# Patient Record
Sex: Female | Born: 1985 | Race: White | Hispanic: No | Marital: Single | Smoking: Never smoker
Health system: Southern US, Community
[De-identification: ages and names within clinical notes are randomized; demographics above are authoritative.]

## PROBLEM LIST (undated history)

## (undated) DIAGNOSIS — R569 Unspecified convulsions: Secondary | ICD-10-CM

## (undated) DIAGNOSIS — G629 Polyneuropathy, unspecified: Secondary | ICD-10-CM

---

## 2015-02-12 ENCOUNTER — Emergency Department (HOSPITAL_BASED_OUTPATIENT_CLINIC_OR_DEPARTMENT_OTHER): Payer: Medicaid - Out of State

## 2015-02-12 ENCOUNTER — Emergency Department (HOSPITAL_BASED_OUTPATIENT_CLINIC_OR_DEPARTMENT_OTHER)
Admission: EM | Admit: 2015-02-12 | Discharge: 2015-02-12 | Disposition: A | Discharge status: Home / Self Care | Payer: Medicaid - Out of State | Attending: Emergency Medicine | Admitting: Emergency Medicine

## 2015-02-12 ENCOUNTER — Encounter (HOSPITAL_BASED_OUTPATIENT_CLINIC_OR_DEPARTMENT_OTHER): Payer: Self-pay | Admitting: *Deleted

## 2015-02-12 DIAGNOSIS — Z3490 Encounter for supervision of normal pregnancy, unspecified, unspecified trimester: Secondary | ICD-10-CM

## 2015-02-12 DIAGNOSIS — Z79899 Other long term (current) drug therapy: Secondary | ICD-10-CM | POA: Insufficient documentation

## 2015-02-12 DIAGNOSIS — Z791 Long term (current) use of non-steroidal anti-inflammatories (NSAID): Secondary | ICD-10-CM | POA: Insufficient documentation

## 2015-02-12 DIAGNOSIS — O23591 Infection of other part of genital tract in pregnancy, first trimester: Secondary | ICD-10-CM | POA: Diagnosis not present

## 2015-02-12 DIAGNOSIS — N760 Acute vaginitis: Secondary | ICD-10-CM

## 2015-02-12 DIAGNOSIS — Z3A01 Less than 8 weeks gestation of pregnancy: Secondary | ICD-10-CM | POA: Diagnosis not present

## 2015-02-12 DIAGNOSIS — O209 Hemorrhage in early pregnancy, unspecified: Secondary | ICD-10-CM

## 2015-02-12 DIAGNOSIS — B9689 Other specified bacterial agents as the cause of diseases classified elsewhere: Secondary | ICD-10-CM

## 2015-02-12 DIAGNOSIS — Z8669 Personal history of other diseases of the nervous system and sense organs: Secondary | ICD-10-CM | POA: Insufficient documentation

## 2015-02-12 DIAGNOSIS — O9989 Other specified diseases and conditions complicating pregnancy, childbirth and the puerperium: Secondary | ICD-10-CM | POA: Diagnosis present

## 2015-02-12 HISTORY — DX: Polyneuropathy, unspecified: G62.9

## 2015-02-12 HISTORY — DX: Unspecified convulsions: R56.9

## 2015-02-12 LAB — URINE MICROSCOPIC-ADD ON

## 2015-02-12 LAB — WET PREP, GENITAL
Trich, Wet Prep: NONE SEEN
Yeast Wet Prep HPF POC: NONE SEEN

## 2015-02-12 LAB — URINALYSIS, ROUTINE W REFLEX MICROSCOPIC
Glucose, UA: NEGATIVE mg/dL
KETONES UR: 15 mg/dL — AB
Nitrite: NEGATIVE
Protein, ur: NEGATIVE mg/dL
SPECIFIC GRAVITY, URINE: 1.028 (ref 1.005–1.030)
UROBILINOGEN UA: 1 mg/dL (ref 0.0–1.0)
pH: 5.5 (ref 5.0–8.0)

## 2015-02-12 LAB — PREGNANCY, URINE: PREG TEST UR: POSITIVE — AB

## 2015-02-12 LAB — HCG, QUANTITATIVE, PREGNANCY: hCG, Beta Chain, Quant, S: 32734 m[IU]/mL — ABNORMAL HIGH (ref <5)

## 2015-02-12 MED ORDER — METRONIDAZOLE 500 MG PO TABS: 500.0000 mg | ORAL_TABLET | Freq: Two times a day (BID) | ORAL | Status: AC

## 2015-02-12 MED ORDER — METOCLOPRAMIDE HCL 5 MG/ML IJ SOLN
10.0000 mg | Freq: Once | INTRAMUSCULAR | Status: AC
  Administered 2015-02-12: 10 mg via INTRAVENOUS
  Filled 2015-02-12: qty 2

## 2015-02-12 MED ORDER — METOCLOPRAMIDE HCL 10 MG PO TABS: 10.0000 mg | ORAL_TABLET | Freq: Four times a day (QID) | ORAL | Status: AC

## 2015-02-12 MED ORDER — SODIUM CHLORIDE 0.9 % IV BOLUS (SEPSIS)
1000.0000 mL | Freq: Once | INTRAVENOUS | Status: AC
  Administered 2015-02-12: 1000 mL via INTRAVENOUS

## 2015-02-12 MED ORDER — PRENATAL COMPLETE 14-0.4 MG PO TABS: 14.0000 mg | ORAL_TABLET | Freq: Every day | ORAL | Status: AC

## 2015-02-12 NOTE — ED Notes (Signed)
Pt sts she found out 3 days ago that she is pregnant. Today she noticed light pink discharge.

## 2015-02-12 NOTE — ED Notes (Signed)
Patient transported to Ultrasound 

## 2015-02-12 NOTE — ED Notes (Signed)
Pt sts she cannot provide urine sample at this time.

## 2015-02-12 NOTE — Discharge Instructions (Signed)
Take Reglan as directed for nausea. Take prenatal vitamins daily. Take Flagyl twice daily for 1 week. Follow-up with OB/GYN.  Bacterial Vaginosis Bacterial vaginosis is a vaginal infection that occurs when the normal balance of bacteria in the vagina is disrupted. It results from an overgrowth of certain bacteria. This is the most common vaginal infection in women of childbearing age. Treatment is important to prevent complications, especially in pregnant women, as it can cause a premature delivery. CAUSES  Bacterial vaginosis is caused by an increase in harmful bacteria that are normally present in smaller amounts in the vagina. Several different kinds of bacteria can cause bacterial vaginosis. However, the reason that the condition develops is not fully understood. RISK FACTORS Certain activities or behaviors can put you at an increased risk of developing bacterial vaginosis, including:  Having a new sex partner or multiple sex partners.  Douching.  Using an intrauterine device (IUD) for contraception. Women do not get bacterial vaginosis from toilet seats, bedding, swimming pools, or contact with objects around them. SIGNS AND SYMPTOMS  Some women with bacterial vaginosis have no signs or symptoms. Common symptoms include:  Grey vaginal discharge.  A fishlike odor with discharge, especially after sexual intercourse.  Itching or burning of the vagina and vulva.  Burning or pain with urination. DIAGNOSIS  Your health care provider will take a medical history and examine the vagina for signs of bacterial vaginosis. A sample of vaginal fluid may be taken. Your health care provider will look at this sample under a microscope to check for bacteria and abnormal cells. A vaginal pH test may also be done.  TREATMENT  Bacterial vaginosis may be treated with antibiotic medicines. These may be given in the form of a pill or a vaginal cream. A second round of antibiotics may be prescribed if the  condition comes back after treatment.  HOME CARE INSTRUCTIONS   Only take over-the-counter or prescription medicines as directed by your health care provider.  If antibiotic medicine was prescribed, take it as directed. Make sure you finish it even if you start to feel better.  Do not have sex until treatment is completed.  Tell all sexual partners that you have a vaginal infection. They should see their health care provider and be treated if they have problems, such as a mild rash or itching.  Practice safe sex by using condoms and only having one sex partner. SEEK MEDICAL CARE IF:   Your symptoms are not improving after 3 days of treatment.  You have increased discharge or pain.  You have a fever. MAKE SURE YOU:   Understand these instructions.  Will watch your condition.  Will get help right away if you are not doing well or get worse. FOR MORE INFORMATION  Centers for Disease Control and Prevention, Division of STD Prevention: SolutionApps.[employer].za American Sexual Health Association (ASHA): www.ashastd.org  Document Released: 08/09/2005 Document Revised: 05/30/2013 Document Reviewed: 03/21/2013 [facility] Patient Information 2015 [place], [place]. This information is not intended to replace advice given to you by your health care provider. Make sure you discuss any questions you have with your health care provider.  Prenatal Care  WHAT IS PRENATAL CARE?  Prenatal care means health care during your pregnancy, before your baby is born. It is very important to take care of yourself and your baby during your pregnancy by:   Getting early prenatal care. If you know you are pregnant, or think you might be pregnant, call your health care provider as soon as  possible. Schedule a visit for a prenatal exam.  Getting regular prenatal care. Follow your health care provider's schedule for blood and other necessary tests. Do not miss appointments.  Doing everything you can to keep yourself and  your baby healthy during your pregnancy.  Getting complete care. Prenatal care should include evaluation of the medical, dietary, educational, psychological, and social needs of you and your significant other. The medical and genetic history of your family and the family of your baby's father should be discussed with your health care provider.  Discussing with your health care provider:  Prescription, over-the-counter, and herbal medicines that you take.  Any history of substance abuse, alcohol use, smoking, and illegal drug use.  Any history of domestic abuse and violence.  Immunizations you have received.  Your nutrition and diet.  The amount of exercise you do.  Any environmental and occupational hazards to which you are exposed.  History of sexually transmitted infections for both you and your partner.  Previous pregnancies you have had. WHY IS PRENATAL CARE SO IMPORTANT?  By regularly seeing your health care provider, you help ensure that problems can be identified early so that they can be treated as soon as possible. Other problems might be prevented. Many studies have shown that early and regular prenatal care is important for the health of mothers and their babies.  HOW CAN I TAKE CARE OF MYSELF WHILE I AM PREGNANT?  Here are ways to take care of yourself and your baby:   Start or continue taking your multivitamin with 400 micrograms (mcg) of folic acid every day.  Get early and regular prenatal care. It is very important to see a health care provider during your pregnancy. Your health care provider will check at each visit to make sure that you and your baby are healthy. If there are any problems, action can be taken right away to help you and your baby.  Eat a healthy diet that includes:  Fruits.  Vegetables.  Foods low in saturated fat.  Whole grains.  Calcium-rich foods, such as milk, yogurt, and hard cheeses.  Drink 6-8 glasses of liquids a day.  Unless  your health care provider tells you not to, try to be physically active for 30 minutes, most days of the week. If you are pressed for time, you can get your activity in through 10-minute segments, three times a day.  Do not smoke, drink alcohol, or use drugs. These can cause long-term damage to your baby. Talk with your health care provider about steps to take to stop smoking. Talk with a member of your faith community, a counselor, a trusted friend, or your health care provider if you are concerned about your alcohol or drug use.  Ask your health care provider before taking any medicine, even over-the-counter medicines. Some medicines are not safe to take during pregnancy.  Get plenty of rest and sleep.  Avoid hot tubs and saunas during pregnancy.  Do not have X-rays taken unless absolutely necessary and with the recommendation of your health care provider. A lead shield can be placed on your abdomen to protect your baby when X-rays are taken in other parts of your body.  Do not empty the cat litter when you are pregnant. It may contain a parasite that causes an infection called toxoplasmosis, which can cause birth defects. Also, use gloves when working in garden areas used by cats.  Do not eat uncooked or undercooked meats or fish.  Do not eat soft,  mold-ripened cheeses (Brie, Camembert, and chevre) or soft, blue-veined cheese (Danish blue and Roquefort).  Stay away from toxic chemicals like:  Insecticides.  Solvents (some cleaners or paint thinners).  Lead.  Mercury.  Sexual intercourse may continue until the end of the pregnancy, unless you have a medical problem or there is a problem with the pregnancy and your health care provider tells you not to.  Do not wear high-heel shoes, especially during the second half of the pregnancy. You can lose your balance and fall.  Do not take long trips, unless absolutely necessary. Be sure to see your health care provider before going on the  trip.  Do not sit in one position for more than 2 hours when on a trip.  Take a copy of your medical records when going on a trip. Know where a hospital is located in the city you are visiting, in case of an emergency.  Most dangerous household products will have pregnancy warnings on their labels. Ask your health care provider about products if you are unsure.  Limit or eliminate your caffeine intake from coffee, tea, sodas, medicines, and chocolate.  Many women continue working through pregnancy. Staying active might help you stay healthier. If you have a question about the safety or the hours you work at your particular job, talk with your health care provider.  Get informed:  Read books.  Watch videos.  Go to childbirth classes for you and your significant other.  Talk with experienced moms.  Ask your health care provider about childbirth education classes for you and your partner. Classes can help you and your partner prepare for the birth of your baby.  Ask about a baby doctor (pediatrician) and methods and pain medicine for labor, delivery, and possible cesarean delivery. HOW OFTEN SHOULD I SEE MY HEALTH CARE PROVIDER DURING PREGNANCY?  Your health care provider will give you a schedule for your prenatal visits. You will have visits more often as you get closer to the end of your pregnancy. An average pregnancy lasts about 40 weeks.  A typical schedule includes visiting your health care provider:   About once each month during your first 6 months of pregnancy.  Every 2 weeks during the next 2 months.  Weekly in the last month, until the delivery date. Your health care provider will probably want to see you more often if:  You are older than 35 years.  Your pregnancy is high risk because you have certain health problems or problems with the pregnancy, such as:  Diabetes.  High blood pressure.  The baby is not growing on schedule, according to the dates of the  pregnancy. Your health care provider will do special tests to make sure you and your baby are not having any serious problems. WHAT HAPPENS DURING PRENATAL VISITS?   At your first prenatal visit, your health care provider will do a physical exam and talk to you about your health history and the health history of your partner and your family. Your health care provider will be able to tell you what date to expect your baby to be born on.  Your first physical exam will include checks of your blood pressure, measurements of your height and weight, and an exam of your pelvic organs. Your health care provider will do a Pap test if you have not had one recently and will do cultures of your cervix to make sure there is no infection.  At each prenatal visit, there will be tests  of your blood, urine, blood pressure, weight, and the progress of the baby will be checked.  At your later prenatal visits, your health care provider will check how you are doing and how your baby is developing. You may have a number of tests done as your pregnancy progresses.  Ultrasound exams are often used to check on your baby's growth and health.  You may have more urine and blood tests, as well as special tests, if needed. These may include amniocentesis to examine fluid in the pregnancy sac, stress tests to check how the baby responds to contractions, or a biophysical profile to measure your baby's well-being. Your health care provider will explain the tests and why they are necessary.  You should be tested for high blood sugar (gestational diabetes) between the 24th and 28th weeks of your pregnancy.  You should discuss with your health care provider your plans to breastfeed or bottle-feed your baby.  Each visit is also a chance for you to learn about staying healthy during pregnancy and to ask questions. Document Released: 08/12/2003 Document Revised: 08/14/2013 Document Reviewed: 10/24/2013 [facility] Patient Information  2015 [place], [place]. This information is not intended to replace advice given to you by your health care provider. Make sure you discuss any questions you have with your health care provider.  First Trimester of Pregnancy The first trimester of pregnancy is from week 1 until the end of week 12 (months 1 through 3). A week after a sperm fertilizes an egg, the egg will implant on the wall of the uterus. This embryo will begin to develop into a baby. Genes from you and your partner are forming the baby. The female genes determine whether the baby is a boy or a girl. At 6-8 weeks, the eyes and face are formed, and the heartbeat can be seen on ultrasound. At the end of 12 weeks, all the baby's organs are formed.  Now that you are pregnant, you will want to do everything you can to have a healthy baby. Two of the most important things are to get good prenatal care and to follow your health care provider's instructions. Prenatal care is all the medical care you receive before the baby's birth. This care will help prevent, find, and treat any problems during the pregnancy and childbirth. BODY CHANGES Your body goes through many changes during pregnancy. The changes vary from woman to woman.   You may gain or lose a couple of pounds at first.  You may feel sick to your stomach (nauseous) and throw up (vomit). If the vomiting is uncontrollable, call your health care provider.  You may tire easily.  You may develop headaches that can be relieved by medicines approved by your health care provider.  You may urinate more often. Painful urination may mean you have a bladder infection.  You may develop heartburn as a result of your pregnancy.  You may develop constipation because certain hormones are causing the muscles that push waste through your intestines to slow down.  You may develop hemorrhoids or swollen, bulging veins (varicose veins).  Your breasts may begin to grow larger and become tender. Your  nipples may stick out more, and the tissue that surrounds them (areola) may become darker.  Your gums may bleed and may be sensitive to brushing and flossing.  Dark spots or blotches (chloasma, mask of pregnancy) may develop on your face. This will likely fade after the baby is born.  Your menstrual periods will stop.  You  may have a loss of appetite.  You may develop cravings for certain kinds of food.  You may have changes in your emotions from day to day, such as being excited to be pregnant or being concerned that something may go wrong with the pregnancy and baby.  You may have more vivid and strange dreams.  You may have changes in your hair. These can include thickening of your hair, rapid growth, and changes in texture. Some women also have hair loss during or after pregnancy, or hair that feels dry or thin. Your hair will most likely return to normal after your baby is born. WHAT TO EXPECT AT YOUR PRENATAL VISITS During a routine prenatal visit:  You will be weighed to make sure you and the baby are growing normally.  Your blood pressure will be taken.  Your abdomen will be measured to track your baby's growth.  The fetal heartbeat will be listened to starting around week 10 or 12 of your pregnancy.  Test results from any previous visits will be discussed. Your health care provider may ask you:  How you are feeling.  If you are feeling the baby move.  If you have had any abnormal symptoms, such as leaking fluid, bleeding, severe headaches, or abdominal cramping.  If you have any questions. Other tests that may be performed during your first trimester include:  Blood tests to find your blood type and to check for the presence of any previous infections. They will also be used to check for low iron levels (anemia) and Rh antibodies. Later in the pregnancy, blood tests for diabetes will be done along with other tests if problems develop.  Urine tests to check for  infections, diabetes, or protein in the urine.  An ultrasound to confirm the proper growth and development of the baby.  An amniocentesis to check for possible genetic problems.  Fetal screens for spina bifida and Down syndrome.  You may need other tests to make sure you and the baby are doing well. HOME CARE INSTRUCTIONS  Medicines  Follow your health care provider's instructions regarding medicine use. Specific medicines may be either safe or unsafe to take during pregnancy.  Take your prenatal vitamins as directed.  If you develop constipation, try taking a stool softener if your health care provider approves. Diet  Eat regular, well-balanced meals. Choose a variety of foods, such as meat or vegetable-based protein, fish, milk and low-fat dairy products, vegetables, fruits, and whole grain breads and cereals. Your health care provider will help you determine the amount of weight gain that is right for you.  Avoid raw meat and uncooked cheese. These carry germs that can cause birth defects in the baby.  Eating four or five small meals rather than three large meals a day may help relieve nausea and vomiting. If you start to feel nauseous, eating a few soda crackers can be helpful. Drinking liquids between meals instead of during meals also seems to help nausea and vomiting.  If you develop constipation, eat more high-fiber foods, such as fresh vegetables or fruit and whole grains. Drink enough fluids to keep your urine clear or pale yellow. Activity and Exercise  Exercise only as directed by your health care provider. Exercising will help you:  Control your weight.  Stay in shape.  Be prepared for labor and delivery.  Experiencing pain or cramping in the lower abdomen or low back is a good sign that you should stop exercising. Check with your health care  provider before continuing normal exercises.  Try to avoid standing for long periods of time. Move your legs often if you  must stand in one place for a long time.  Avoid heavy lifting.  Wear low-heeled shoes, and practice good posture.  You may continue to have sex unless your health care provider directs you otherwise. Relief of Pain or Discomfort  Wear a good support bra for breast tenderness.   Take warm sitz baths to soothe any pain or discomfort caused by hemorrhoids. Use hemorrhoid cream if your health care provider approves.   Rest with your legs elevated if you have leg cramps or low back pain.  If you develop varicose veins in your legs, wear support hose. Elevate your feet for 15 minutes, 3-4 times a day. Limit salt in your diet. Prenatal Care  Schedule your prenatal visits by the twelfth week of pregnancy. They are usually scheduled monthly at first, then more often in the last 2 months before delivery.  Write down your questions. Take them to your prenatal visits.  Keep all your prenatal visits as directed by your health care provider. Safety  Wear your seat belt at all times when driving.  Make a list of emergency phone numbers, including numbers for family, friends, the hospital, and police and fire departments. General Tips  Ask your health care provider for a referral to a local prenatal education class. Begin classes no later than at the beginning of month 6 of your pregnancy.  Ask for help if you have counseling or nutritional needs during pregnancy. Your health care provider can offer advice or refer you to specialists for help with various needs.  Do not use hot tubs, steam rooms, or saunas.  Do not douche or use tampons or scented sanitary pads.  Do not cross your legs for long periods of time.  Avoid cat litter boxes and soil used by cats. These carry germs that can cause birth defects in the baby and possibly loss of the fetus by miscarriage or stillbirth.  Avoid all smoking, herbs, alcohol, and medicines not prescribed by your health care provider. Chemicals in these  affect the formation and growth of the baby.  Schedule a dentist appointment. At home, brush your teeth with a soft toothbrush and be gentle when you floss. SEEK MEDICAL CARE IF:   You have dizziness.  You have mild pelvic cramps, pelvic pressure, or nagging pain in the abdominal area.  You have persistent nausea, vomiting, or diarrhea.  You have a bad smelling vaginal discharge.  You have pain with urination.  You notice increased swelling in your face, hands, legs, or ankles. SEEK IMMEDIATE MEDICAL CARE IF:   You have a fever.  You are leaking fluid from your vagina.  You have spotting or bleeding from your vagina.  You have severe abdominal cramping or pain.  You have rapid weight gain or loss.  You vomit blood or material that looks like coffee grounds.  You are exposed to [place] measles and have never had them.  You are exposed to fifth disease or chickenpox.  You develop a severe headache.  You have shortness of breath.  You have any kind of trauma, such as from a fall or a car accident. Document Released: 08/03/2001 Document Revised: 12/24/2013 Document Reviewed: 06/19/2013 [facility] Patient Information 2015 [place], [place]. This information is not intended to replace advice given to you by your health care provider. Make sure you discuss any questions you have with your health care  provider.

## 2015-02-12 NOTE — ED Provider Notes (Signed)
CSN: [id number]     Arrival date & time 02/12/15  1514 History   First MD Initiated Contact with Patient 02/12/15 1521     Chief Complaint  Patient presents with  . Vaginal Discharge     (Consider location/radiation/quality/duration/timing/severity/associated sxs/prior Treatment) HPI Comments: 29 y/o F G1P0 presenting after noticing light pink discharge yesterday. Three days ago, she took 3 positive home pregnancy tests. Over the past few days, she has felt very nauseous and has had multiple episodes of NBNB emesis, and is only able to keep down apples and crackers. States she is feeling weak. This morning, she noticed a smaller amount of light pink discharge, and she does wanted to make sure that everything was okay today. Admits to mild epigastric abdominal pain only when vomiting. Denies any other abdominal pain. Denies fevers or urinary symptoms.  The history is provided by the patient.    Past Medical History  Diagnosis Date  . Seizures   . Neuropathy    History reviewed. No pertinent past surgical history. No family history on file. History  Substance Use Topics  . Smoking status: Never Smoker   . Smokeless tobacco: Not on file  . Alcohol Use: No   OB History    No data available     Review of Systems  Genitourinary: Positive for vaginal bleeding.  All other systems reviewed and are negative.     Allergies  Review of patient's allergies indicates no known allergies.  Home Medications   Prior to Admission medications   Medication Sig Start Date End Date Taking? Authorizing Provider  meloxicam (MOBIC) 7.5 MG tablet Take 7.5 mg by mouth daily.   Yes Historical Provider, MD  tiZANidine (ZANAFLEX) 4 MG tablet Take 4 mg by mouth every 6 (six) hours as needed for muscle spasms.   Yes Historical Provider, MD  metoCLOPramide (REGLAN) 10 MG tablet Take 1 tablet (10 mg total) by mouth every 6 (six) hours. 02/12/15   Anubis Fundora M Aleczander Fandino, PA-C  metroNIDAZOLE (FLAGYL) 500 MG tablet  Take 1 tablet (500 mg total) by mouth 2 (two) times daily. One po bid x 7 days 02/12/15   Kathrynn Speed, PA-C  Prenatal Vit-Fe Fumarate-FA (PRENATAL COMPLETE) 14-0.4 MG TABS Take 14 mg by mouth daily. 02/12/15   Amaura Authier M Oyuki Hogan, PA-C   BP 112/59 mmHg  Pulse 76  Temp(Src) 98.1 F (36.7 C) (Oral)  Resp 18  Ht 5' (1.524 m)  Wt 121 lb (54.885 kg)  BMI 23.63 kg/m2  SpO2 100%  LMP 12/23/2014 Physical Exam  Constitutional: She is oriented to person, place, and time. She appears well-developed and well-nourished. No distress.  HENT:  Head: Normocephalic and atraumatic.  Mouth/Throat: Oropharynx is clear and moist.  Eyes: Conjunctivae and EOM are normal.  Neck: Normal range of motion. Neck supple.  Cardiovascular: Normal rate, regular rhythm and normal heart sounds.   Pulmonary/Chest: Effort normal and breath sounds normal. No respiratory distress.  Abdominal: Soft. Bowel sounds are normal. She exhibits no distension. There is no tenderness.  Genitourinary: Uterus normal. Cervix exhibits no motion tenderness, no discharge and no friability. Right adnexum displays no mass and no tenderness. Left adnexum displays no mass and no tenderness. No tenderness or bleeding in the vagina. No foreign body around the vagina. No vaginal discharge found.  Musculoskeletal: Normal range of motion. She exhibits no edema.  Neurological: She is alert and oriented to person, place, and time. No sensory deficit.  Skin: Skin is warm and dry.  Psychiatric:  She has a normal mood and affect. Her behavior is normal.  Nursing note and vitals reviewed.   ED Course  Procedures (including critical care time) Labs Review Labs Reviewed  WET PREP, GENITAL - Abnormal; Notable for the following:    Clue Cells Wet Prep HPF POC MODERATE (*)    WBC, Wet Prep HPF POC MODERATE (*)    All other components within normal limits  URINALYSIS, ROUTINE W REFLEX MICROSCOPIC (NOT AT [facility]) - Abnormal; Notable for the following:    Color,  Urine AMBER (*)    APPearance CLOUDY (*)    Hgb urine dipstick MODERATE (*)    Bilirubin Urine SMALL (*)    Ketones, ur 15 (*)    Leukocytes, UA SMALL (*)    All other components within normal limits  PREGNANCY, URINE - Abnormal; Notable for the following:    Preg Test, Ur POSITIVE (*)    All other components within normal limits  HCG, QUANTITATIVE, PREGNANCY - Abnormal; Notable for the following:    hCG, Beta Chain, Quant, S 32734 (*)    All other components within normal limits  URINE MICROSCOPIC-ADD ON - Abnormal; Notable for the following:    Squamous Epithelial / LPF MANY (*)    Bacteria, UA FEW (*)    Crystals CA OXALATE CRYSTALS (*)    All other components within normal limits  URINE CULTURE  GC/CHLAMYDIA PROBE AMP ([place]) NOT AT [facility]    Imaging Review US Ob Comp Less 14 Wks  02/12/2015   CLINICAL DATA:  Patient with vaginal spotting.  EXAM: OBSTETRIC <14 WK [place] AND TRANSVAGINAL OB US  TECHNIQUE: Both transabdominal and transvaginal ultrasound examinations were performed for complete evaluation of the gestation as well as the maternal uterus, adnexal regions, and pelvic cul-de-sac. Transvaginal technique was performed to assess early pregnancy.  COMPARISON:  None.  FINDINGS: Intrauterine gestational sac: Visualized/normal in shape.  Yolk sac:  Present  Embryo:  Present  Cardiac Activity: Present  Heart Rate: 99  bpm  CRL:  2  mm   5 w   5 d                  [place] EDC: 10/08/2015  Maternal uterus/adnexae: Normal right and left ovaries. No subchorionic hemorrhage. No free fluid in the pelvis.  IMPRESSION: Single live intrauterine gestation 6 weeks 0 days by crown-rump length.   Electronically Signed   By: Annia Belt M.D.   On: 02/12/2015 18:07   US Ob Transvaginal  02/12/2015   CLINICAL DATA:  Patient with vaginal spotting.  EXAM: OBSTETRIC <14 WK [place] AND TRANSVAGINAL OB US  TECHNIQUE: Both transabdominal and transvaginal ultrasound examinations were performed for complete  evaluation of the gestation as well as the maternal uterus, adnexal regions, and pelvic cul-de-sac. Transvaginal technique was performed to assess early pregnancy.  COMPARISON:  None.  FINDINGS: Intrauterine gestational sac: Visualized/normal in shape.  Yolk sac:  Present  Embryo:  Present  Cardiac Activity: Present  Heart Rate: 99  bpm  CRL:  2  mm   5 w   5 d                  [place] EDC: 10/08/2015  Maternal uterus/adnexae: Normal right and left ovaries. No subchorionic hemorrhage. No free fluid in the pelvis.  IMPRESSION: Single live intrauterine gestation 6 weeks 0 days by crown-rump length.   Electronically Signed   By: Annia Belt M.D.   On: 02/12/2015 18:07  EKG Interpretation None      MDM   Final diagnoses:  Intrauterine pregnancy  BV (bacterial vaginosis)   Nontoxic appearing, NAD. AF VSS. Abdomen is soft and nontender. No vaginal bleeding noted on exam. Urine pregnancy positive. Wet prep significant for clue cells. Will treat BV with Flagyl. Ultrasound obtained given the patient stated some spotting with a positive pregnancy. Single live intrauterine gestation 6 weeks 0 days noted. Discussed these findings with patient, will start her on prenatal vitamins, Reglan for nausea. Tolerating PO. Discussed importance of OB/GYN follow-up. Stable for discharge. Return precautions given. Patient states understanding of treatment care plan and is agreeable.  Kathrynn Speed, PA-C 02/12/15 1610  Rolan Bucco, MD 02/12/15 [phone]

## 2015-02-13 LAB — GC/CHLAMYDIA PROBE AMP (~~LOC~~) NOT AT ARMC
CHLAMYDIA, DNA PROBE: NEGATIVE
Neisseria Gonorrhea: NEGATIVE

## 2015-02-15 LAB — URINE CULTURE

## 2017-02-09 IMAGING — US US OB TRANSVAGINAL
1 series · 14 of 28 positions shown · non-contrast
Comparison: None.

CLINICAL DATA: Patient with vaginal spotting.

EXAM:
OBSTETRIC <14 WK US AND TRANSVAGINAL OB US
TECHNIQUE: Both transabdominal and transvaginal ultrasound examinations were
performed for complete evaluation of the gestation as well as the
maternal uterus, adnexal regions, and pelvic cul-de-sac.
Transvaginal technique was performed to assess early pregnancy.

[Series 1: us ob transvaginal · 0.09mm/px · 31 acquisitions, 14 frames shown]
[im 2/31]
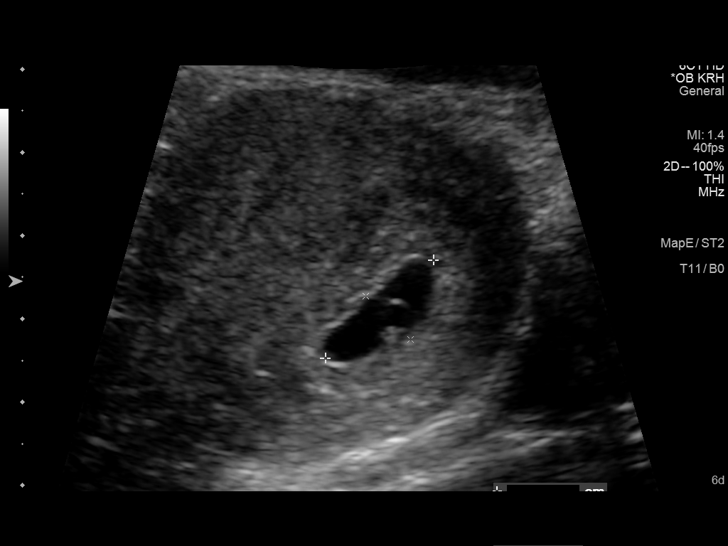
[im 4/31]
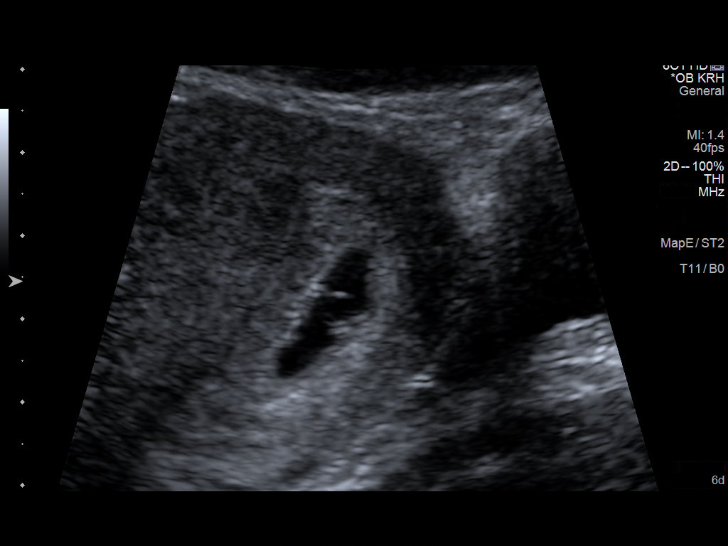
[im 6/31]
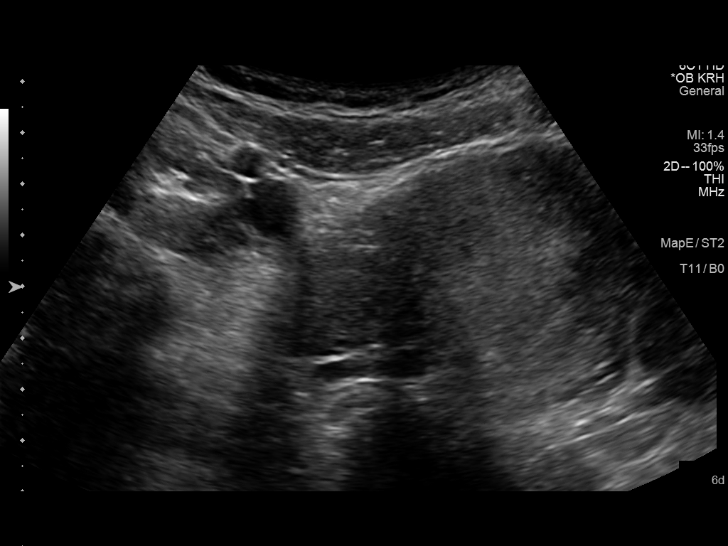
[im 8/31]
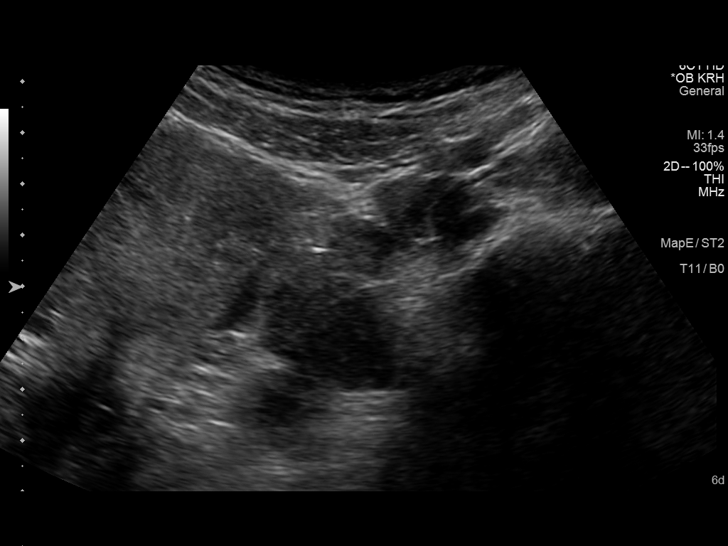
[im 11/31]
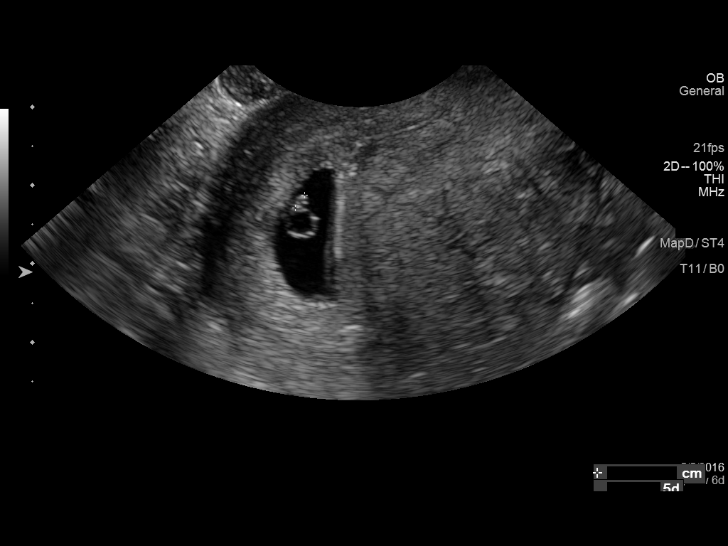
[im 13/31]
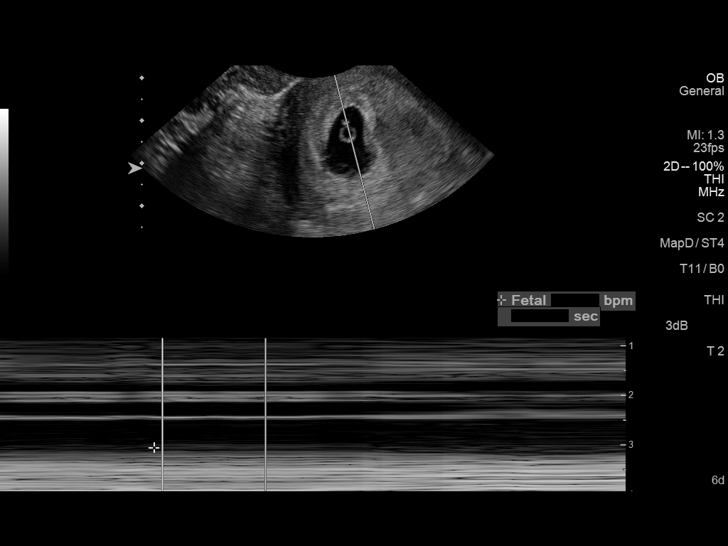
[im 15/31]
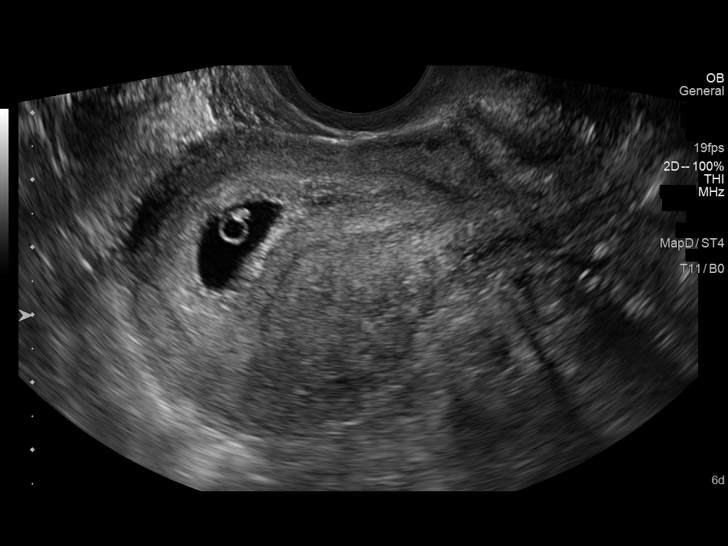
[im 17/31]
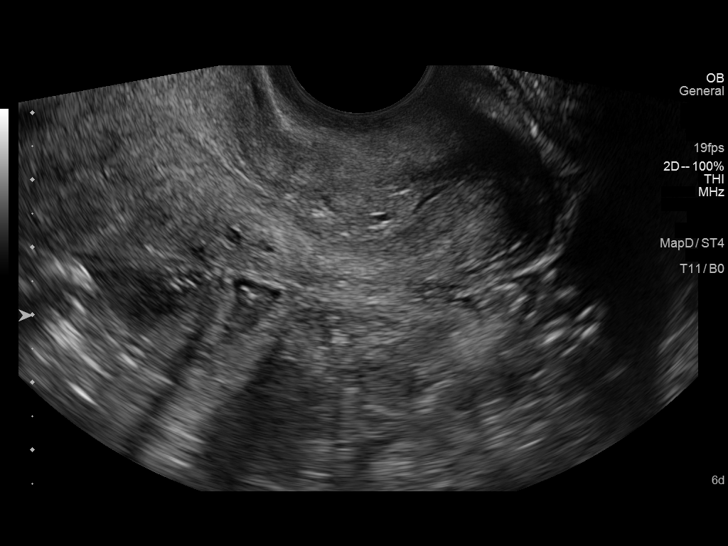
[im 19/31]
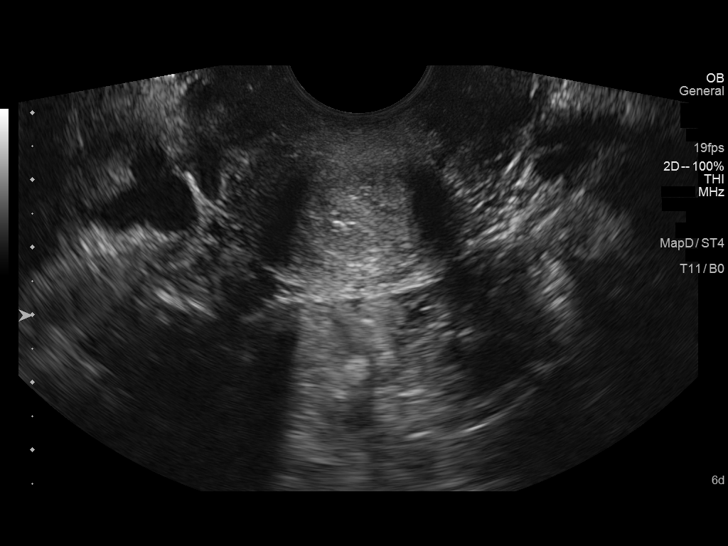
[im 22/31]
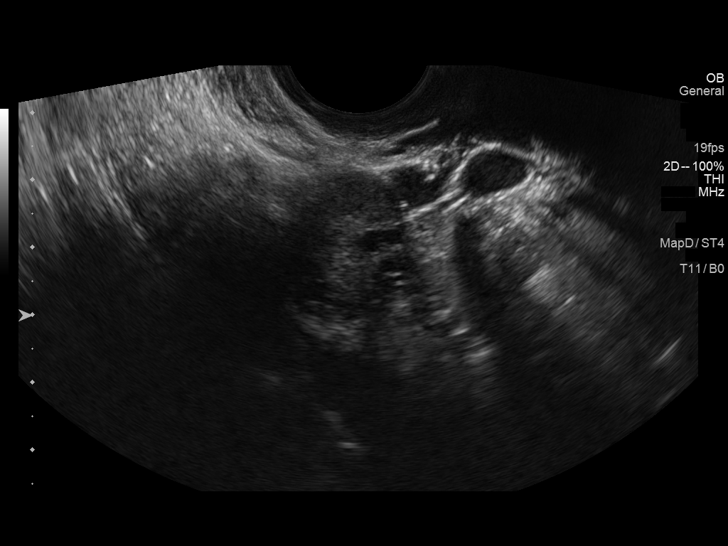
[im 24/31]
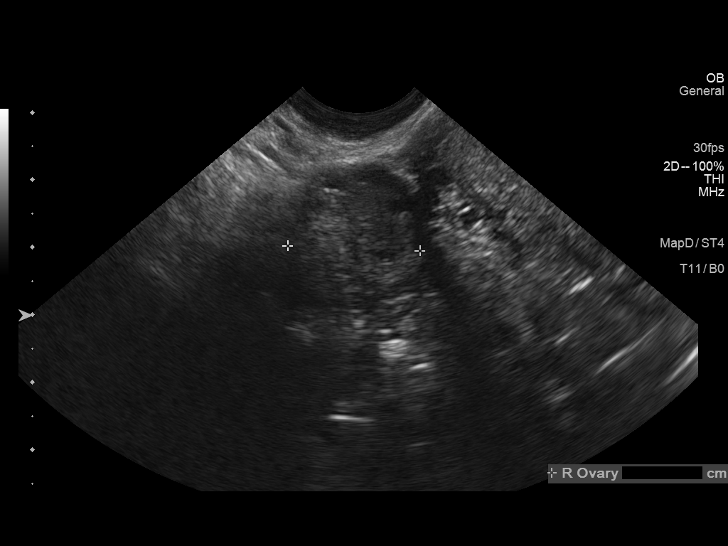
[im 26/31]
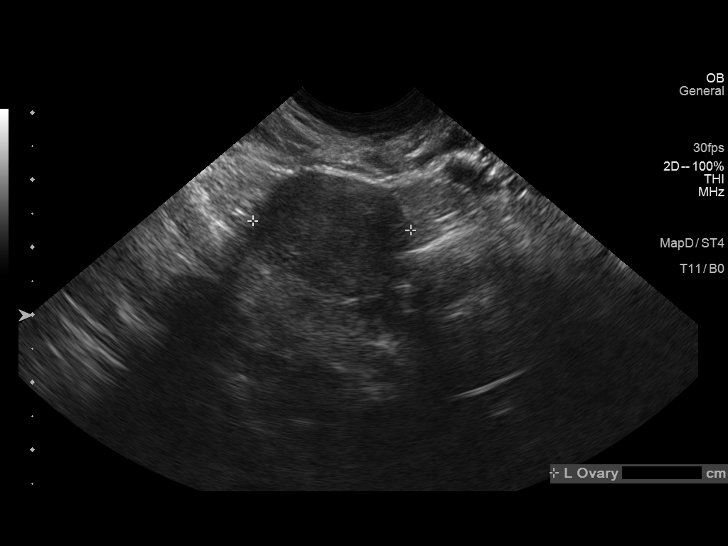
[im 28/31]
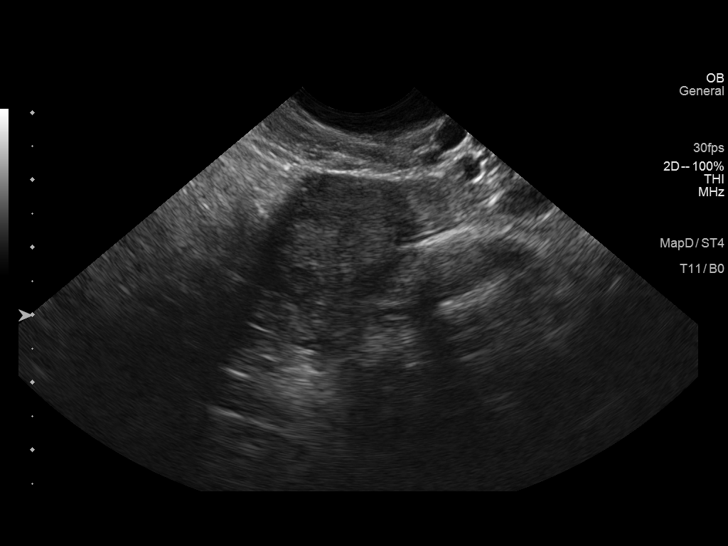
[im 31/31]
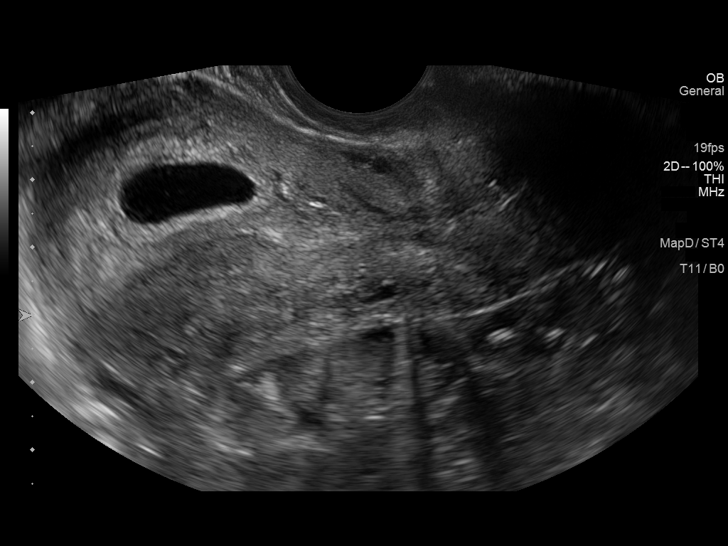

[14 of 28 positions shown; findings below may reference images not displayed]

FINDINGS: Intrauterine gestational sac: Visualized/normal in shape.

Yolk sac:  Present

Embryo:  Present

Cardiac Activity: Present

Heart Rate: 99  bpm

CRL:  2  mm   5 w   5 d                  [place] EDC: 10/08/2015

Maternal uterus/adnexae: Normal right and left ovaries. No
subchorionic hemorrhage. No free fluid in the pelvis.
IMPRESSION: Single live intrauterine gestation 6 weeks 0 days by crown-rump
length.

## 2019-05-01 ENCOUNTER — Other Ambulatory Visit: Payer: Self-pay

## 2019-05-01 DIAGNOSIS — Z20822 Contact with and (suspected) exposure to covid-19: Secondary | ICD-10-CM

## 2019-05-02 LAB — NOVEL CORONAVIRUS, NAA: SARS-CoV-2, NAA: NOT DETECTED

## 2023-11-06 ENCOUNTER — Encounter (HOSPITAL_BASED_OUTPATIENT_CLINIC_OR_DEPARTMENT_OTHER): Payer: Self-pay | Admitting: Emergency Medicine

## 2023-11-06 ENCOUNTER — Other Ambulatory Visit: Payer: Self-pay

## 2023-11-06 ENCOUNTER — Emergency Department (HOSPITAL_BASED_OUTPATIENT_CLINIC_OR_DEPARTMENT_OTHER)
Admission: EM | Admit: 2023-11-06 | Discharge: 2023-11-06 | Disposition: A | Discharge status: Home / Self Care | Attending: Emergency Medicine | Admitting: Emergency Medicine

## 2023-11-06 ENCOUNTER — Emergency Department (HOSPITAL_BASED_OUTPATIENT_CLINIC_OR_DEPARTMENT_OTHER)

## 2023-11-06 DIAGNOSIS — N83202 Unspecified ovarian cyst, left side: Secondary | ICD-10-CM | POA: Diagnosis not present

## 2023-11-06 DIAGNOSIS — R1032 Left lower quadrant pain: Secondary | ICD-10-CM | POA: Diagnosis present

## 2023-11-06 LAB — COMPREHENSIVE METABOLIC PANEL
ALT: 11 U/L (ref 0–44)
AST: 13 U/L — ABNORMAL LOW (ref 15–41)
Albumin: 3.9 g/dL (ref 3.5–5.0)
Alkaline Phosphatase: 50 U/L (ref 38–126)
Anion gap: 6 (ref 5–15)
BUN: 12 mg/dL (ref 6–20)
CO2: 23 mmol/L (ref 22–32)
Calcium: 8.8 mg/dL — ABNORMAL LOW (ref 8.9–10.3)
Chloride: 108 mmol/L (ref 98–111)
Creatinine, Ser: 0.69 mg/dL (ref 0.44–1.00)
GFR, Estimated: >60 mL/min (ref >60)
Glucose, Bld: 90 mg/dL (ref 70–99)
Potassium: 4.2 mmol/L (ref 3.5–5.1)
Sodium: 137 mmol/L (ref 135–145)
Total Bilirubin: 0.5 mg/dL (ref 0.0–1.2)
Total Protein: 7 g/dL (ref 6.5–8.1)

## 2023-11-06 LAB — URINALYSIS, W/ REFLEX TO CULTURE (INFECTION SUSPECTED)
Bilirubin Urine: NEGATIVE
Glucose, UA: NEGATIVE mg/dL
Hgb urine dipstick: NEGATIVE
Ketones, ur: NEGATIVE mg/dL
Leukocytes,Ua: NEGATIVE
Nitrite: NEGATIVE
Protein, ur: NEGATIVE mg/dL
Specific Gravity, Urine: 1.025 (ref 1.005–1.030)
pH: 7 (ref 5.0–8.0)

## 2023-11-06 LAB — CBC WITH DIFFERENTIAL/PLATELET
Abs Immature Granulocytes: 0.01 10*3/uL (ref 0.00–0.07)
Basophils Absolute: 0 10*3/uL (ref 0.0–0.1)
Basophils Relative: 1 %
Eosinophils Absolute: 0 10*3/uL (ref 0.0–0.5)
Eosinophils Relative: 1 %
HCT: 37.9 % (ref 36.0–46.0)
Hemoglobin: 12.9 g/dL (ref 12.0–15.0)
Immature Granulocytes: 0 %
Lymphocytes Relative: 22 %
Lymphs Abs: 1.1 10*3/uL (ref 0.7–4.0)
MCH: 31 pg (ref 26.0–34.0)
MCHC: 34 g/dL (ref 30.0–36.0)
MCV: 91.1 fL (ref 80.0–100.0)
Monocytes Absolute: 0.5 10*3/uL (ref 0.1–1.0)
Monocytes Relative: 10 %
Neutro Abs: 3.2 10*3/uL (ref 1.7–7.7)
Neutrophils Relative %: 66 %
Platelets: 196 10*3/uL (ref 150–400)
RBC: 4.16 MIL/uL (ref 3.87–5.11)
RDW: 12.8 % (ref 11.5–15.5)
WBC: 4.9 10*3/uL (ref 4.0–10.5)
nRBC: 0 % (ref 0.0–0.2)

## 2023-11-06 LAB — HCG, QUANTITATIVE, PREGNANCY: hCG, Beta Chain, Quant, S: <1 m[IU]/mL (ref <5)

## 2023-11-06 MED ORDER — KETOROLAC TROMETHAMINE 30 MG/ML IJ SOLN
30.0000 mg | Freq: Once | INTRAMUSCULAR | Status: AC
Start: 2023-11-06 — End: 2023-11-06
  Administered 2023-11-06: 30 mg via INTRAVENOUS
  Filled 2023-11-06: qty 1

## 2023-11-06 MED ORDER — DICYCLOMINE HCL 20 MG PO TABS: 20.0000 mg | ORAL_TABLET | Freq: Two times a day (BID) | ORAL | 0 refills | Status: AC

## 2023-11-06 MED ORDER — DICYCLOMINE HCL 10 MG PO CAPS
10.0000 mg | ORAL_CAPSULE | Freq: Once | ORAL | Status: AC
  Administered 2023-11-06: 10 mg via ORAL
  Filled 2023-11-06: qty 1

## 2023-11-06 MED ORDER — ACETAMINOPHEN 500 MG PO TABS
1000.0000 mg | ORAL_TABLET | Freq: Once | ORAL | Status: AC
  Administered 2023-11-06: 1000 mg via ORAL
  Filled 2023-11-06: qty 2

## 2023-11-06 MED ORDER — KETOROLAC TROMETHAMINE 10 MG PO TABS: 10.0000 mg | ORAL_TABLET | Freq: Four times a day (QID) | ORAL | 0 refills | Status: AC | PRN

## 2023-11-06 MED ORDER — ONDANSETRON HCL 4 MG/2ML IJ SOLN
4.0000 mg | Freq: Once | INTRAMUSCULAR | Status: AC
  Administered 2023-11-06: 4 mg via INTRAVENOUS
  Filled 2023-11-06: qty 2

## 2023-11-06 MED ORDER — MORPHINE SULFATE (PF) 4 MG/ML IV SOLN
4.0000 mg | Freq: Once | INTRAVENOUS | Status: AC
  Administered 2023-11-06: 4 mg via INTRAVENOUS
  Filled 2023-11-06: qty 1

## 2023-11-06 NOTE — Discharge Instructions (Addendum)
 While you are in the emergency room, you are diagnosed with a 3.8 cm cyst.  There is on your left ovary.  I do believe this could be contributing to the symptoms that you are having.  I have sent a prescription for Toradol, which is a strong anti-inflammatory.  Do not take any Motrin, ibuprofen or Aleve with this medicine.  I also recommend continue to take your prescribed omeprazole for this.  He can also take a medicine called Bentyl, which can sometimes help with crampy abdominal pain.  Please call your OB/GYN tomorrow to set up a follow-up appointment.  Return to the emergency room for sudden worsening of abdominal pain.

## 2023-11-06 NOTE — ED Provider Notes (Signed)
 [facility] Provider Note  CSN: [id number] Arrival date & time: 11/06/23 1107  Chief Complaint(s) Abdominal Pain  HPI Gabrielle Kim is a 38 y.o. female here today for left lower abdominal pain that began last evening.  Patient has a history of a known ovarian cyst on that side.  Says late last evening she began to have some pain discomfort.  She states her last menstrual period was at the end of February of this past year, however she states her menstrual cycles have become increasingly infrequent.   Past Medical History Past Medical History:  Diagnosis Date   Neuropathy    Seizures (HCC)    There are no active problems to display for this patient.  Home Medication(s) Prior to Admission medications   Medication Sig Start Date End Date Taking? Authorizing Provider  meloxicam (MOBIC) 7.5 MG tablet Take 7.5 mg by mouth daily.    [provider]  metoCLOPramide (REGLAN) 10 MG tablet Take 1 tablet (10 mg total) by mouth every 6 (six) hours. 02/12/15   Hess, Nada Boozer, PA-C  metroNIDAZOLE (FLAGYL) 500 MG tablet Take 1 tablet (500 mg total) by mouth 2 (two) times daily. One po bid x 7 days 02/12/15   Kathrynn Speed, PA-C  Prenatal Vit-Fe Fumarate-FA (PRENATAL COMPLETE) 14-0.4 MG TABS Take 14 mg by mouth daily. 02/12/15   Hess, Nada Boozer, PA-C  tiZANidine (ZANAFLEX) 4 MG tablet Take 4 mg by mouth every 6 (six) hours as needed for muscle spasms.    [provider]                                                                                                                                    Past Surgical History History reviewed. No pertinent surgical history. Family History History reviewed. No pertinent family history.  Social History Social History   Tobacco Use   Smoking status: Never  Vaping Use   Vaping status: Never Used  Substance Use Topics   Alcohol use: No   Drug use: No   Allergies Patient has no known  allergies.  Review of Systems Review of Systems  Physical Exam Vital Signs  I have reviewed the triage vital signs BP 112/73   Pulse (!) 59   Temp 98.1 F (36.7 C) (Oral)   Resp (!) 24   Ht 5' (1.524 m)   Wt 76.2 kg   LMP 10/14/2023   SpO2 99%   BMI 32.81 kg/m   Physical Exam Vitals reviewed.  Constitutional:      Appearance: She is not toxic-appearing.  Abdominal:     Tenderness: There is abdominal tenderness in the left lower quadrant.     ED Results and Treatments Labs (all labs ordered are listed, but only abnormal results are displayed) Labs Reviewed  COMPREHENSIVE METABOLIC PANEL - Abnormal; Notable for the following components:      Result Value  Calcium 8.8 (*)    AST 13 (*)    All other components within normal limits  URINALYSIS, W/ REFLEX TO CULTURE (INFECTION SUSPECTED) - Abnormal; Notable for the following components:   Bacteria, UA RARE (*)    All other components within normal limits  CBC WITH DIFFERENTIAL/PLATELET  HCG, QUANTITATIVE, PREGNANCY                                                                                                                          Radiology US PELVIC COMPLETE W TRANSVAGINAL AND TORSION R/O Result Date: 11/06/2023 CLINICAL DATA:  Left lower quadrant pain for 1 day with worsening, FLAIR with movement. EXAM: TRANSABDOMINAL AND TRANSVAGINAL ULTRASOUND OF PELVIS DOPPLER ULTRASOUND OF OVARIES TECHNIQUE: Both transabdominal and transvaginal ultrasound examinations of the pelvis were performed. Transabdominal technique was performed for global imaging of the pelvis including uterus, ovaries, adnexal regions, and pelvic cul-de-sac. It was necessary to proceed with endovaginal exam following the transabdominal exam to visualize the ovaries and endometrium. Color and duplex Doppler ultrasound was utilized to evaluate blood flow to the ovaries. COMPARISON:  02/12/2015 FINDINGS: Uterus Measurements: 10 x 5 x 7 cm = volume: 190 mL. No  fibroids or other mass visualized. Endometrium Thickness: 15 mm. There are 2 ring like structures prolapsing into the endocervical canal, cystic spaces measuring up to 4 mm. Patient has a negative pregnancy test. Right ovary Measurements: 34 x 17 x 19 mm = volume: 6 mL. Normal appearance/no adnexal mass. Left ovary Measurements: 37 x 27 x 32 mm = volume: 16 mL. Normal appearance of the ovary with cumulous oophorus appearance. There is a extra ovarian lobulated cyst measuring up to 3.8 cm, ovoid but not clearly tubular to imply hydrosalpinx. Pulsed Doppler evaluation of both ovaries demonstrates normal low-resistance arterial and venous waveforms. Other findings No abnormal free fluid. IMPRESSION: 1. No acute finding.  Normal ovarian blood flow. 2. Left para ovarian cyst measuring up to 3.8 cm. 3. Two sac-like structures at the endocervical canal, favor nabothian cysts but somewhat unusual wall thickness. These are new from 2016, suggest sonographic follow-up to document stability. Electronically Signed   By: Tiburcio Pea M.D.   On: 11/06/2023 12:37    Pertinent labs & imaging results that were available during my care of the patient were reviewed by me and considered in my medical decision making (see MDM for details).  Medications Ordered in ED Medications  ketorolac (TORADOL) 30 MG/ML injection 30 mg (30 mg Intravenous Given 11/06/23 1233)  ondansetron (ZOFRAN) injection 4 mg (4 mg Intravenous Given 11/06/23 1233)  morphine (PF) 4 MG/ML injection 4 mg (4 mg Intravenous Given 11/06/23 1236)  dicyclomine (BENTYL) capsule 10 mg (10 mg Oral Given 11/06/23 1309)  acetaminophen (TYLENOL) tablet 1,000 mg (1,000 mg Oral Given 11/06/23 1312)  Procedures Procedures  (including critical care time)  Medical Decision Making / ED Course   This patient presents to the ED for  concern of left lower quadrant abdominal pain, this involves an extensive number of treatment options, and is a complaint that carries with it a high risk of complications and morbidity.  The differential diagnosis includes ovarian torsion, hemorrhagic cyst, less likely TOA, ectopic, less likely cystitis, less likely nephrolithiasis.  MDM: Patient uncomfortable appearing, does have pain in the left lower quadrant.  Analgesia provided.  Pregnancy negative.  Will obtain an ultrasound to assess for torsion.  Clinically, I think it is less likely, however patient may have a very high pain tolerance.  Patient previous had issues with gallstones, however she says these have been significantly improved.  No right upper quadrant tenderness.  No right lower quadrant tenderness.  Reassessment 2:05 PM-patient's pain is a bit improved.  Her ultrasound imaging does not show torsion, ovaries not significantly enlarged.  There is a 3.8 cm cyst.  This is the area of the patient's pain.  I considered whether or not to obtain CT imaging on the patient, however I believe that this ultrasound is answered to her symptoms.  There is likely small amount of hemorrhage occurring.  Will treat patient with Toradol, send with prescription.  Return precautions discussed extensively with patient and patient's partner at bedside.  Will provide OB/GYN follow-up.   Additional history obtained: -Additional history obtained from partner at bedside -External records from outside source obtained and reviewed including: Chart review including previous notes, labs, imaging, consultation notes   Lab Tests: -I ordered, reviewed, and interpreted labs.   The pertinent results include:   Labs Reviewed  COMPREHENSIVE METABOLIC PANEL - Abnormal; Notable for the following components:      Result Value   Calcium 8.8 (*)    AST 13 (*)    All other components within normal limits  URINALYSIS, W/ REFLEX TO CULTURE (INFECTION SUSPECTED) -  Abnormal; Notable for the following components:   Bacteria, UA RARE (*)    All other components within normal limits  CBC WITH DIFFERENTIAL/PLATELET  HCG, QUANTITATIVE, PREGNANCY        Imaging Studies ordered: I ordered imaging studies including ultrasound I independently visualized and interpreted imaging. I agree with the radiologist interpretation   Medicines ordered and prescription drug management: Meds ordered this encounter  Medications   ketorolac (TORADOL) 30 MG/ML injection 30 mg   ondansetron (ZOFRAN) injection 4 mg   morphine (PF) 4 MG/ML injection 4 mg   dicyclomine (BENTYL) capsule 10 mg   acetaminophen (TYLENOL) tablet 1,000 mg    -I have reviewed the patients home medicines and have made adjustments as needed   Reevaluation: After the interventions noted above, I reevaluated the patient and found that they have :improved  Co morbidities that complicate the patient evaluation  Past Medical History:  Diagnosis Date   Neuropathy    Seizures (HCC)       Dispostion: I considered admission for this patient, however patient's symptoms have resolved, she overall looks better.  Believe she is appropriate for outpatient follow-up and symptomatic management.     Final Clinical Impression(s) / ED Diagnoses Final diagnoses:  Cyst of left ovary     @PCDICTATION @    Anders Simmonds T, DO 11/06/23 1423

## 2023-11-06 NOTE — ED Triage Notes (Signed)
 Lower left abd pain , has a hx of cyst  left ovary  that was sig , has had some nausea

## 2023-11-06 NOTE — ED Notes (Signed)
 Pt transported to [place]
